# Patient Record
Sex: Male | Born: 1995 | Race: White | Hispanic: No | Marital: Single | Smoking: Never smoker
Health system: Southern US, Community
[De-identification: ages and names within clinical notes are randomized; demographics above are authoritative.]

## PROBLEM LIST (undated history)

## (undated) DIAGNOSIS — B86 Scabies: Secondary | ICD-10-CM

## (undated) DIAGNOSIS — K219 Gastro-esophageal reflux disease without esophagitis: Secondary | ICD-10-CM

## (undated) DIAGNOSIS — T50Z95A Adverse effect of other vaccines and biological substances, initial encounter: Secondary | ICD-10-CM

## (undated) DIAGNOSIS — B079 Viral wart, unspecified: Secondary | ICD-10-CM

## (undated) HISTORY — DX: Viral wart, unspecified: B07.9

## (undated) HISTORY — DX: Gastro-esophageal reflux disease without esophagitis: K21.9

## (undated) HISTORY — DX: Scabies: B86

## (undated) HISTORY — DX: Adverse effect of other vaccines and biological substances, initial encounter: T50.Z95A

## (undated) HISTORY — PX: OTHER SURGICAL HISTORY: SHX169

---
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
## 2016-01-09 ENCOUNTER — Ambulatory Visit (HOSPITAL_COMMUNITY)
Admission: RE | Admit: 2016-01-09 | Discharge: 2016-01-09 | Disposition: A | Discharge status: Home / Self Care | Payer: Medicaid Other | Source: Ambulatory Visit | Attending: Internal Medicine | Admitting: Internal Medicine

## 2016-01-09 ENCOUNTER — Other Ambulatory Visit (HOSPITAL_COMMUNITY): Payer: Self-pay | Admitting: Internal Medicine

## 2016-01-09 DIAGNOSIS — M542 Cervicalgia: Secondary | ICD-10-CM | POA: Diagnosis present

## 2016-08-17 ENCOUNTER — Emergency Department (HOSPITAL_COMMUNITY): Payer: Medicaid Other

## 2016-08-17 ENCOUNTER — Emergency Department (HOSPITAL_COMMUNITY)
Admission: EM | Admit: 2016-08-17 | Discharge: 2016-08-17 | Disposition: A | Discharge status: Home / Self Care | Payer: Medicaid Other | Attending: Emergency Medicine | Admitting: Emergency Medicine

## 2016-08-17 ENCOUNTER — Encounter (HOSPITAL_COMMUNITY): Payer: Self-pay | Admitting: *Deleted

## 2016-08-17 DIAGNOSIS — D696 Thrombocytopenia, unspecified: Secondary | ICD-10-CM | POA: Insufficient documentation

## 2016-08-17 DIAGNOSIS — R202 Paresthesia of skin: Secondary | ICD-10-CM | POA: Diagnosis not present

## 2016-08-17 DIAGNOSIS — R200 Anesthesia of skin: Secondary | ICD-10-CM | POA: Diagnosis present

## 2016-08-17 DIAGNOSIS — R0789 Other chest pain: Secondary | ICD-10-CM | POA: Diagnosis not present

## 2016-08-17 LAB — CBC WITH DIFFERENTIAL/PLATELET
BASOS ABS: 0 10*3/uL (ref 0.0–0.1)
BASOS PCT: 0 %
EOS ABS: 0.1 10*3/uL (ref 0.0–0.7)
Eosinophils Relative: 1 %
HCT: 43.5 % (ref 39.0–52.0)
HEMOGLOBIN: 15.6 g/dL (ref 13.0–17.0)
Lymphocytes Relative: 37 %
Lymphs Abs: 2.4 10*3/uL (ref 0.7–4.0)
MCH: 30.6 pg (ref 26.0–34.0)
MCHC: 35.9 g/dL (ref 30.0–36.0)
MCV: 85.5 fL (ref 78.0–100.0)
Monocytes Absolute: 0.5 10*3/uL (ref 0.1–1.0)
Monocytes Relative: 8 %
Neutro Abs: 3.5 10*3/uL (ref 1.7–7.7)
Neutrophils Relative %: 54 %
Platelets: 133 10*3/uL — ABNORMAL LOW (ref 150–400)
RBC: 5.09 MIL/uL (ref 4.22–5.81)
RDW: 12.1 % (ref 11.5–15.5)
WBC: 6.5 10*3/uL (ref 4.0–10.5)

## 2016-08-17 LAB — BASIC METABOLIC PANEL
Anion gap: 7 (ref 5–15)
BUN: 9 mg/dL (ref 6–20)
CALCIUM: 9.9 mg/dL (ref 8.9–10.3)
CO2: 28 mmol/L (ref 22–32)
CREATININE: 0.95 mg/dL (ref 0.61–1.24)
Chloride: 108 mmol/L (ref 101–111)
Glucose, Bld: 102 mg/dL — ABNORMAL HIGH (ref 65–99)
Potassium: 3.8 mmol/L (ref 3.5–5.1)
SODIUM: 143 mmol/L (ref 135–145)

## 2016-08-17 LAB — I-STAT TROPONIN, ED: TROPONIN I, POC: 0 ng/mL (ref 0.00–0.08)

## 2016-08-17 NOTE — ED Provider Notes (Signed)
[facility] DEPT Provider Note   CSN: [id number] Arrival date & time: 08/17/16  0033     History   Chief Complaint Chief Complaint  Patient presents with  . Numbness    HPI Carl Higgins is a 21 y.o. male.  The history is provided by the patient.  He had onset yesterday morning of numbness on the lateral aspect of his right calf. That has stayed fairly constant. Today, he noted numbness in the lateral aspect of his right upper arm. Numbness is been steady and not changing in location or character. He noted general weakness on getting out of the shower tonight, but no focal weakness. He denies headache or neck pain. Denies any difficulty with walking or balance or coordination. Also, during the day today, he has been having intermittent sharp pains in the left side of his chest. Pain will last about 30 seconds post for resolving. There is some mild dyspnea but no nausea or diaphoresis. Pain is not affected by exertion. He is a nonsmoker and denies no history of diabetes or hypertension or hyperlipidemia. He is worried because family members have had heart attacks when in their 4760s, but no family history of premature coronary atherosclerosis. He denies illicit drug use.  Past Medical History:  Diagnosis Date  . Adverse effect of other vaccines and biological substances, initial encounter   . Gastroesophageal reflux disease without esophagitis   . Scabies   . Viral wart     Patient Active Problem List   Diagnosis Date Noted  . Chest pain 01/26/2015  . Palpitations 01/26/2015    Past Surgical History:  Procedure Laterality Date  . WART REMOVAL         Home Medications    Prior to Admission medications   Not on File    Family History Family History  Problem Relation Age of Onset  . Pancreatitis Mother   . Heart disease Father        WPW  . Bardet-Biedl Syndrome Brother   . Diabetes Maternal Grandfather   . Hypertension Maternal Grandfather   . Heart attack Paternal  Grandfather     Social History Social History  Substance Use Topics  . Smoking status: Never Smoker  . Smokeless tobacco: Never Used  . Alcohol use No     Allergies   Amoxicillin and Sulfa antibiotics   Review of Systems Review of Systems  All other systems reviewed and are negative.    Physical Exam Updated Vital Signs BP (!) 163/92 (BP Location: Right Arm)   Pulse 62   Temp 97.9 F (36.6 C) (Oral)   Resp 18   Ht 5\' 9"  (1.753 m)   Wt 59.9 kg (132 lb)   SpO2 100%   BMI 19.49 kg/m   Physical Exam  Nursing note and vitals reviewed.  21 year old \\male , resting comfortably and in no acute distress. Vital signs are significant for hypertension. Oxygen saturation is 100%, which is normal. Head is normocephalic and atraumatic. PERRLA, EOMI. Oropharynx is clear. Neck is nontender and supple without adenopathy or JVD. Back is nontender and there is no CVA tenderness. Lungs are clear without rales, wheezes, or rhonchi. Chest is nontender. Heart has regular rate and rhythm without murmur. Abdomen is soft, flat, nontender without masses or hepatosplenomegaly and peristalsis is normoactive. Extremities have no cyanosis or edema, full range of motion is present. Skin is warm and dry without rash. Neurologic: Mental status is normal, cranial nerves are intact, there are no motor or  sensory deficits. Strength is 5/5 in both arms and both legs. There is no pronator drift. There is no extinction on double simultaneous stimulation.  ED Treatments / Results  Labs (all labs ordered are listed, but only abnormal results are displayed) Labs Reviewed  BASIC METABOLIC PANEL - Abnormal; Notable for the following:       Result Value   Glucose, Bld 102 (*)    All other components within normal limits  CBC WITH DIFFERENTIAL/PLATELET - Abnormal; Notable for the following:    Platelets 133 (*)    All other components within normal limits  I-STAT TROPOININ, ED    EKG  EKG  Interpretation  Date/Time:  Sunday Aug 17 2016 02:34:32 EDT Ventricular Rate:  64 PR Interval:    QRS Duration: 109 QT Interval:  402 QTC Calculation: 415 R Axis:   98 Text Interpretation:  Sinus rhythm Short PR interval Borderline right axis deviation Probable left ventricular hypertrophy Early repolarization No old tracing to compare Confirmed by Dione Booze ([id number]) on 08/17/2016 2:42:18 AM       Radiology Dg Chest 2 View  Result Date: 08/17/2016 CLINICAL DATA:  Increasing numbness to the right leg for 1 day. Chest pain. EXAM: CHEST  2 VIEW COMPARISON:  None. FINDINGS: The heart size and mediastinal contours are within normal limits. Both lungs are clear. The visualized skeletal structures are unremarkable. IMPRESSION: No active cardiopulmonary disease. Electronically Signed   By: Burman Nieves M.D.   On: 08/17/2016 02:09   Ct Head Wo Contrast  Result Date: 08/17/2016 CLINICAL DATA:  Increasing RIGHT leg numbness for 1 day. EXAM: CT HEAD WITHOUT CONTRAST TECHNIQUE: Contiguous axial images were obtained from the base of the skull through the vertex without intravenous contrast. COMPARISON:  None. FINDINGS: BRAIN: No intraparenchymal hemorrhage, mass effect nor midline shift. The ventricles and sulci are normal. No acute large vascular territory infarcts. No abnormal extra-axial fluid collections. Basal cisterns are patent. VASCULAR: Unremarkable. SKULL/SOFT TISSUES: No skull fracture. No significant soft tissue swelling. ORBITS/SINUSES: The included ocular globes and orbital contents are normal.The mastoid aircells and included paranasal sinuses are well-aerated. OTHER: None. IMPRESSION: Normal noncontrast CT HEAD. Electronically Signed   By: Awilda Metro M.D.   On: 08/17/2016 02:00    Procedures Procedures (including critical care time)  Medications Ordered in ED Medications - No data to display   Initial Impression / Assessment and Plan / ED Course  I have reviewed the  triage vital signs and the nursing notes.  Pertinent labs & imaging results that were available during my care of the patient were reviewed by me and considered in my medical decision making (see chart for details).  Numbness of uncertain cause. No objective neurologic deficit. Chest pain of uncertain cause, but very unlikely to be cardiac. Since neurologic symptoms of been present for well over 24 hours, code stroke is not initiated. Also, CT head should show any stroke which might have caused his symptoms. He is sent for CT of head. MRI is not available here today, but I do not feel he needs to be transferred for emergent MRI. Chest pain is evaluated with ECG and troponin. Old records are reviewed, and he has no relevant past visits.  ED workup is unremarkable. CT scan is unremarkable. ECG and chest x-ray are normal as is laboratory workup with exception of borderline low platelet count. He is reassured that workup is benign and is referred back to PCP for further outpatient workup.  Final Clinical Impressions(s) /  ED Diagnoses   Final diagnoses:  Paresthesia  Atypical chest pain  Thrombocytopenia (HCC)    New Prescriptions New Prescriptions   No medications on file     Dione Booze, MD 08/17/16 [phone]

## 2016-08-17 NOTE — ED Triage Notes (Signed)
Pt c/o increasing numbness to right leg x 1 day

## 2016-08-17 NOTE — Discharge Instructions (Signed)
You may take acetaminophen, ibuprofen or naproxen as needed for pain. Follow up with you doctor for further evaluation of the numbness. Return if symptoms are getting worse.

## 2016-08-17 NOTE — ED Notes (Signed)
Patient transported to X-ray 

## 2017-10-08 IMAGING — DX DG CHEST 2V
2 series · 2 of 2 positions shown · non-contrast
Comparison: None.

CLINICAL DATA: Increasing numbness to the right leg for 1 day.
Chest pain.

EXAM:
CHEST  2 VIEW

[chest pa]
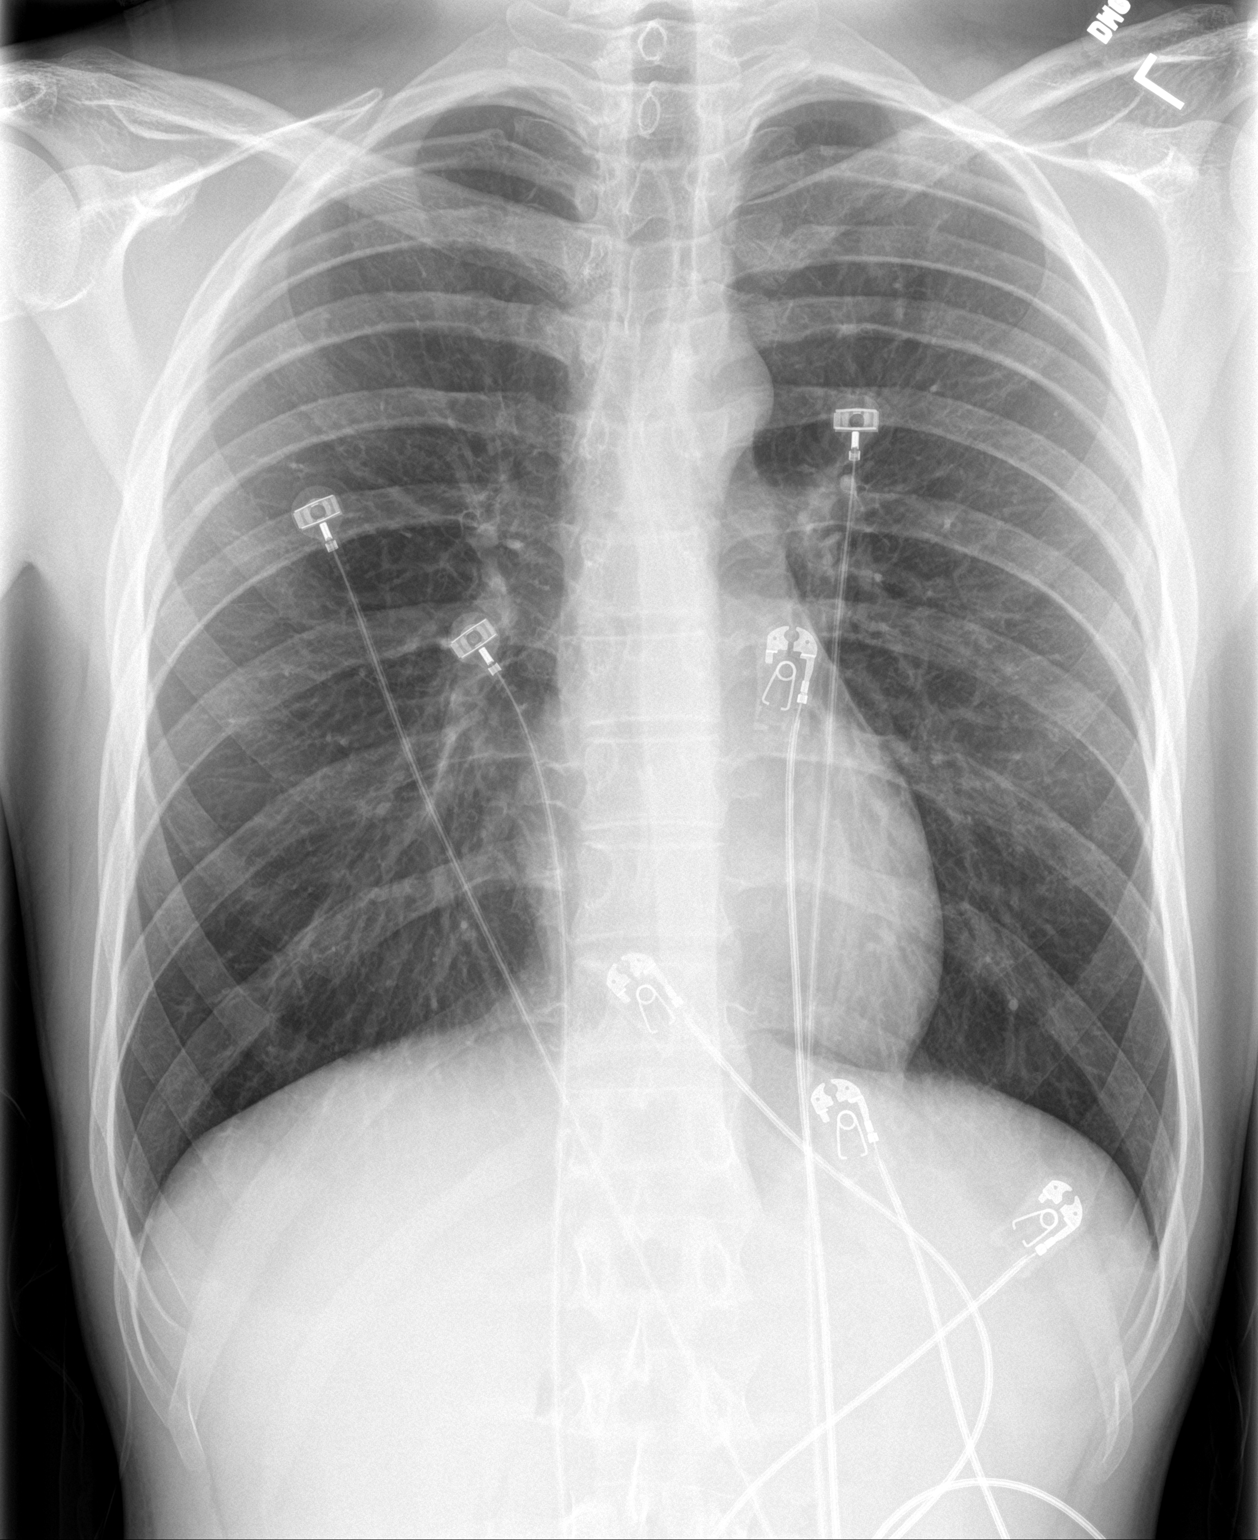

[chest lat]
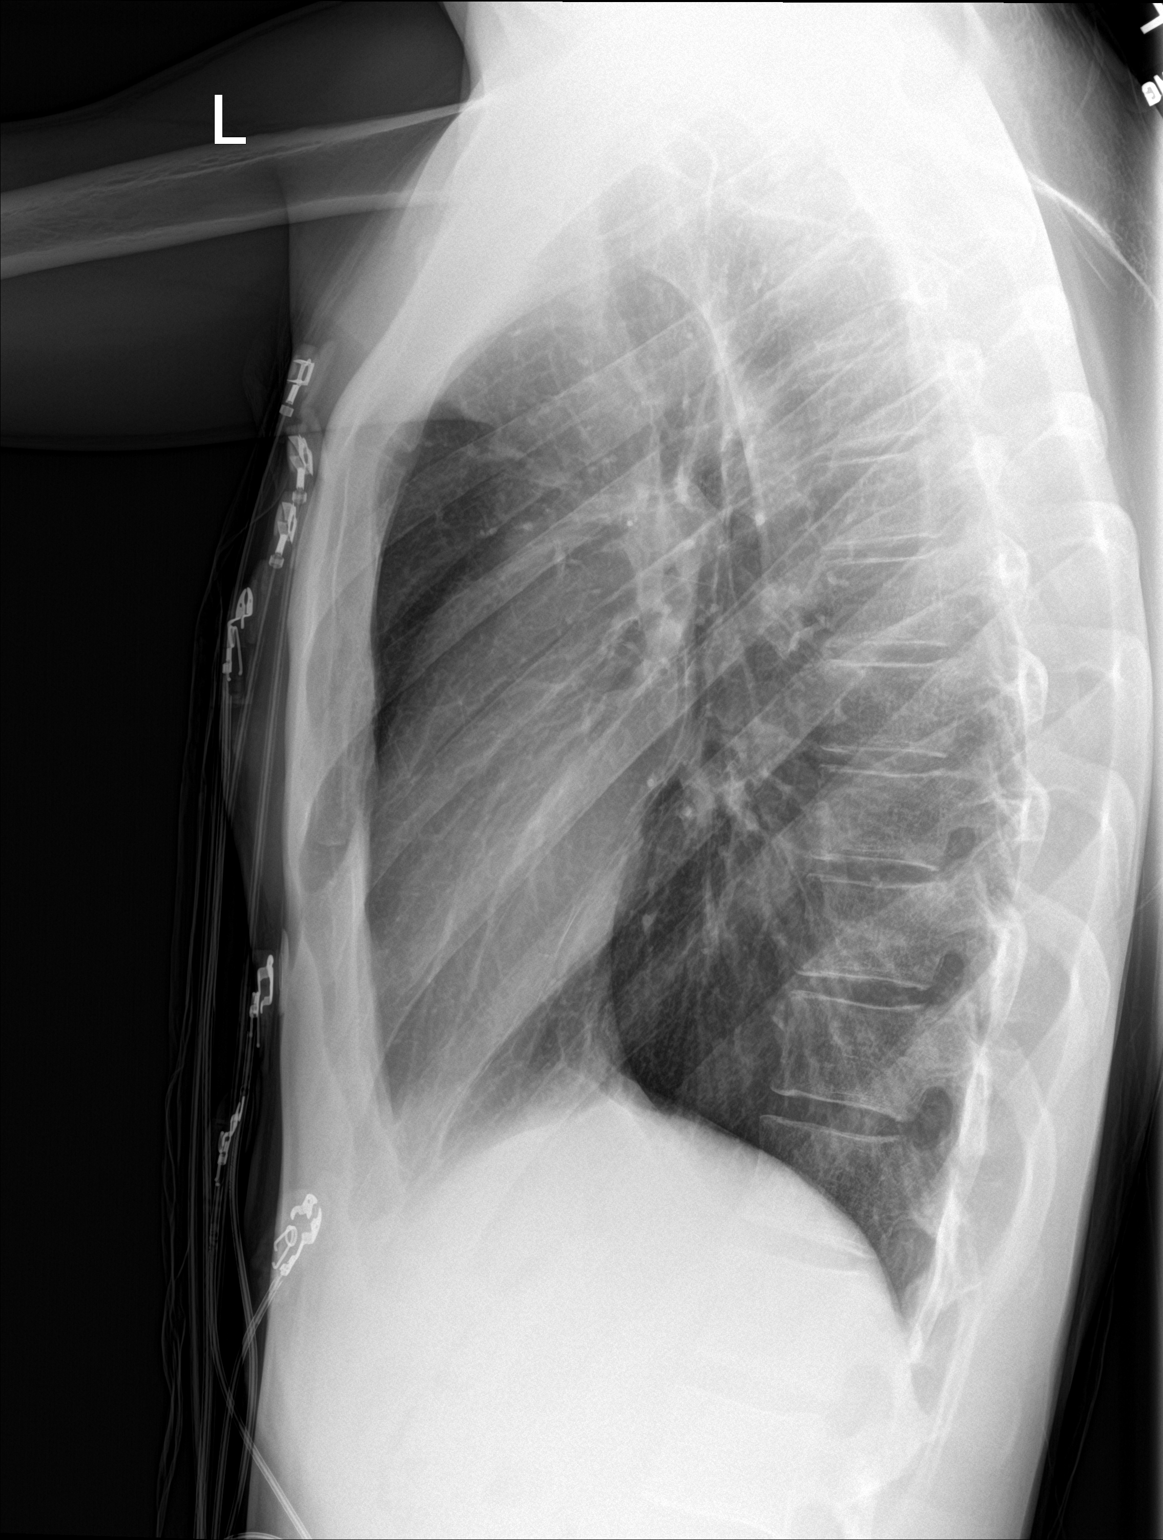

[2 of 2 positions shown; findings below may reference images not displayed]

FINDINGS: The heart size and mediastinal contours are within normal limits.
Both lungs are clear. The visualized skeletal structures are
unremarkable.
IMPRESSION: No active cardiopulmonary disease.

## 2018-09-16 ENCOUNTER — Encounter (HOSPITAL_COMMUNITY): Payer: Self-pay | Admitting: Emergency Medicine

## 2018-09-16 ENCOUNTER — Emergency Department (HOSPITAL_COMMUNITY)
Admission: EM | Admit: 2018-09-16 | Discharge: 2018-09-17 | Disposition: A | Discharge status: Home / Self Care | Payer: Self-pay | Attending: Emergency Medicine | Admitting: Emergency Medicine

## 2018-09-16 ENCOUNTER — Other Ambulatory Visit: Payer: Self-pay

## 2018-09-16 DIAGNOSIS — Y929 Unspecified place or not applicable: Secondary | ICD-10-CM | POA: Insufficient documentation

## 2018-09-16 DIAGNOSIS — W260XXA Contact with knife, initial encounter: Secondary | ICD-10-CM | POA: Insufficient documentation

## 2018-09-16 DIAGNOSIS — S61212A Laceration without foreign body of right middle finger without damage to nail, initial encounter: Secondary | ICD-10-CM | POA: Insufficient documentation

## 2018-09-16 DIAGNOSIS — Z5321 Procedure and treatment not carried out due to patient leaving prior to being seen by health care provider: Secondary | ICD-10-CM | POA: Insufficient documentation

## 2018-09-16 DIAGNOSIS — Y999 Unspecified external cause status: Secondary | ICD-10-CM | POA: Insufficient documentation

## 2018-09-16 DIAGNOSIS — Y93G1 Activity, food preparation and clean up: Secondary | ICD-10-CM | POA: Insufficient documentation

## 2018-09-16 NOTE — ED Triage Notes (Addendum)
Pt states he was getting a knife to cut chicken and the knife slipped out his hand and went to grab the knife and it cut his right middle finger. Bleeding controlled.

## 2019-02-01 ENCOUNTER — Other Ambulatory Visit: Payer: Self-pay

## 2019-02-01 DIAGNOSIS — Z20822 Contact with and (suspected) exposure to covid-19: Secondary | ICD-10-CM

## 2019-02-02 ENCOUNTER — Telehealth: Payer: Self-pay

## 2019-02-02 NOTE — Telephone Encounter (Signed)
Person identifing ans patient wife calling to get his test result. She is not listed on DPR and she was advised to have her husband call back for result.

## 2019-02-02 NOTE — Telephone Encounter (Signed)
Patient called in requesting MyChart set up and COVID19 lab results - DOB/Address verified - results pending. Reviewed testing process with patient; assisted with MyChart set up, no further questions. 

## 2019-02-03 LAB — NOVEL CORONAVIRUS, NAA: SARS-CoV-2, NAA: NOT DETECTED

## 2019-05-30 ENCOUNTER — Ambulatory Visit: Payer: Self-pay | Attending: Internal Medicine

## 2019-05-30 ENCOUNTER — Other Ambulatory Visit: Payer: Self-pay

## 2019-05-30 DIAGNOSIS — Z20822 Contact with and (suspected) exposure to covid-19: Secondary | ICD-10-CM | POA: Insufficient documentation

## 2019-05-31 LAB — NOVEL CORONAVIRUS, NAA: SARS-CoV-2, NAA: NOT DETECTED

## 2020-04-05 ENCOUNTER — Other Ambulatory Visit: Payer: Self-pay

## 2021-06-17 ENCOUNTER — Ambulatory Visit: Payer: Self-pay

## 2021-06-18 ENCOUNTER — Ambulatory Visit (INDEPENDENT_AMBULATORY_CARE_PROVIDER_SITE_OTHER): Payer: PRIVATE HEALTH INSURANCE

## 2021-06-18 ENCOUNTER — Ambulatory Visit (HOSPITAL_COMMUNITY)
Admission: RE | Admit: 2021-06-18 | Discharge: 2021-06-18 | Disposition: A | Discharge status: Home / Self Care | Payer: PRIVATE HEALTH INSURANCE | Source: Ambulatory Visit | Attending: Emergency Medicine | Admitting: Emergency Medicine

## 2021-06-18 ENCOUNTER — Other Ambulatory Visit: Payer: Self-pay

## 2021-06-18 ENCOUNTER — Encounter (HOSPITAL_COMMUNITY): Payer: Self-pay

## 2021-06-18 VITALS — BP 149/83 | HR 82 | Temp 98.3°F | Resp 17

## 2021-06-18 DIAGNOSIS — M79632 Pain in left forearm: Secondary | ICD-10-CM

## 2021-06-18 DIAGNOSIS — W19XXXA Unspecified fall, initial encounter: Secondary | ICD-10-CM | POA: Diagnosis not present

## 2021-06-18 NOTE — ED Triage Notes (Signed)
Pt is present today with left arm injury. Pt states that he tripped and fell on concert Saturday night. Pt states that it is painful to bend his arm and has visible bruising and swelling.  ?

## 2021-06-18 NOTE — ED Provider Notes (Signed)
?[facility] ? ? ? ?CSN: [id number] ?Arrival date & time: 06/18/21  1355 ? ? ?  ? ?History   ?Chief Complaint ?Chief Complaint  ?Patient presents with  ? Arm Injury  ?  [address] and hurt my arm - Entered by patient  ? ? ?HPI ?Haidan Rin is a 26 y.o. male.  ? ?Patient presents with left forearm pain for 3 days after fall.  Endorses that he fell at a concert landing directly onto the area.  Bruising present and pain can be felt when bending the arm.  Pain is radiating into the upper arm.  Denies numbness, tingling, prior injury or trauma.  Has not attempted treatment of symptoms. ? ?Past Medical History:  ?Diagnosis Date  ? Adverse effect of other vaccines and biological substances, initial encounter   ? Gastroesophageal reflux disease without esophagitis   ? Scabies   ? Viral wart   ? ? ?Patient Active Problem List  ? Diagnosis Date Noted  ? Chest pain 01/26/2015  ? Palpitations 01/26/2015  ? ? ?Past Surgical History:  ?Procedure Laterality Date  ? WART REMOVAL    ? ? ? ? ? ?Home Medications   ? ?Prior to Admission medications   ?Not on File  ? ? ?Family History ?Family History  ?Problem Relation Age of Onset  ? Pancreatitis Mother   ? Heart disease Father   ?     WPW  ? Bardet-Biedl Syndrome Brother   ? Diabetes Maternal Grandfather   ? Hypertension Maternal Grandfather   ? Heart attack Paternal Grandfather   ? ? ?Social History ?Social History  ? ?Tobacco Use  ? Smoking status: Never  ? Smokeless tobacco: Never  ?Substance Use Topics  ? Alcohol use: No  ?  Alcohol/week: 0.0 standard drinks  ? Drug use: No  ? ? ? ?Allergies   ?Amoxicillin and Sulfa antibiotics ? ? ?Review of Systems ?Review of Systems ?Defer to hpi ? ? ?Physical Exam ?Triage Vital Signs ?ED Triage Vitals  ?Enc Vitals Group  ?   BP 06/18/21 1424 (!) 149/83  ?   Pulse Rate 06/18/21 1424 82  ?   Resp 06/18/21 1424 17  ?   Temp 06/18/21 1424 98.3 ?F (36.8 ?C)  ?   Temp src --   ?   SpO2 06/18/21 1424 100 %  ?   Weight --   ?   Height --   ?   Head  Circumference --   ?   Peak Flow --   ?   Pain Score 06/18/21 1423 7  ?   Pain Loc --   ?   Pain Edu? --   ?   Excl. in [place]? --   ? ?No data found. ? ?Updated Vital Signs ?BP (!) 149/83   Pulse 82   Temp 98.3 ?F (36.8 ?C)   Resp 17   SpO2 100%  ? ?Visual Acuity ?Right Eye Distance:   ?Left Eye Distance:   ?Bilateral Distance:   ? ?Right Eye Near:   ?Left Eye Near:    ?Bilateral Near:    ? ?Physical Exam ?Constitutional:   ?   Appearance: Normal appearance.  ?Eyes:  ?   Extraocular Movements: Extraocular movements intact.  ?Pulmonary:  ?   Effort: Pulmonary effort is normal.  ?Musculoskeletal:  ?   Comments: Ecchymosis and mild to moderate swelling present to the medial aspect of the left forearm with no involvement of the epicondyles, no point tenderness noted, range of  motion intact, 2+ brachial pulse, sensation intact  ?Neurological:  ?   Mental Status: He is alert and oriented to person, place, and time. Mental status is at baseline.  ?Psychiatric:     ?   Mood and Affect: Mood normal.     ?   Behavior: Behavior normal.  ? ? ? ?UC Treatments / Results  ?Labs ?(all labs ordered are listed, but only abnormal results are displayed) ?Labs Reviewed - No data to display ? ?EKG ? ? ?Radiology ?DG Forearm Left ? ?Result Date: 06/18/2021 ?CLINICAL DATA:  [address] with pain and bruising of the forearm EXAM: LEFT FOREARM - 2 VIEW COMPARISON:  None. FINDINGS: There is no evidence of fracture or other focal bone lesions. Soft tissues are unremarkable. Insignificant small bone island of the distal radius. IMPRESSION: Negative radiographs. Electronically Signed   By: Nelson Chimes M.D.   On: 06/18/2021 15:02   ? ?Procedures ?Procedures (including critical care time) ? ?Medications Ordered in UC ?Medications - No data to display ? ?Initial Impression / Assessment and Plan / UC Course  ?I have reviewed the triage vital signs and the nursing notes. ? ?Pertinent labs & imaging results that were available during my care of the patient  were reviewed by me and considered in my medical decision making (see chart for details). ? ?Left forearm pain ? ?X-ray negative, discussed findings with patient, recommend ibuprofen to use consistently for the next 5 days to further help reduce swelling RICE, activity as tolerated, given if pain continues to persist past 2 weeks, may follow-up with urgent care as needed, work note given ? ? ?Final Clinical Impressions(s) / UC Diagnoses  ? ?Final diagnoses:  ?Left forearm pain  ? ? ? ?Discharge Instructions   ? ?  ?Your pain is most likely caused by irritation to the soft tissues ? ?X-ray was negative for injury to the bone ? ?Take 600 to 800 mg of ibuprofen 3 times a day for the next 5 days to further help reduce the swelling which in turn will help with your discomfort ? ?You may use heating pad in 15 minute intervals as needed for additional comfort or you may find comfort in using ice in 10-15 minutes over affected area ? ?Begin stretching affected area daily for 10 minutes as tolerated to further loosen muscles  ? ?When lying down place pillow underneath arm for support while sitting or lying ? ?If pain persist after recommended treatment or reoccurs if may be beneficial to follow up with orthopedic specialist for evaluation, this doctor specializes in the bones and can manage your symptoms long-term with options such as but not limited to imaging, medications or physical therapy  ?  ? ? ?ED Prescriptions   ?None ?  ? ?PDMP not reviewed this encounter. ?  ?Hans Eden, NP ?06/18/21 1610 ? ?

## 2021-06-18 NOTE — Discharge Instructions (Signed)
Your pain is most likely caused by irritation to the soft tissues ? ?X-ray was negative for injury to the bone ? ?Take 600 to 800 mg of ibuprofen 3 times a day for the next 5 days to further help reduce the swelling which in turn will help with your discomfort ? ?You may use heating pad in 15 minute intervals as needed for additional comfort or you may find comfort in using ice in 10-15 minutes over affected area ? ?Begin stretching affected area daily for 10 minutes as tolerated to further loosen muscles  ? ?When lying down place pillow underneath arm for support while sitting or lying ? ?If pain persist after recommended treatment or reoccurs if may be beneficial to follow up with orthopedic specialist for evaluation, this doctor specializes in the bones and can manage your symptoms long-term with options such as but not limited to imaging, medications or physical therapy  ?  ?

## 2022-08-09 IMAGING — DX DG FOREARM 2V*L*
2 series · 2 of 2 positions shown · non-contrast
Comparison: None.

CLINICAL DATA: Fell with pain and bruising of the forearm

EXAM:
LEFT FOREARM - 2 VIEW

[forearm ap]
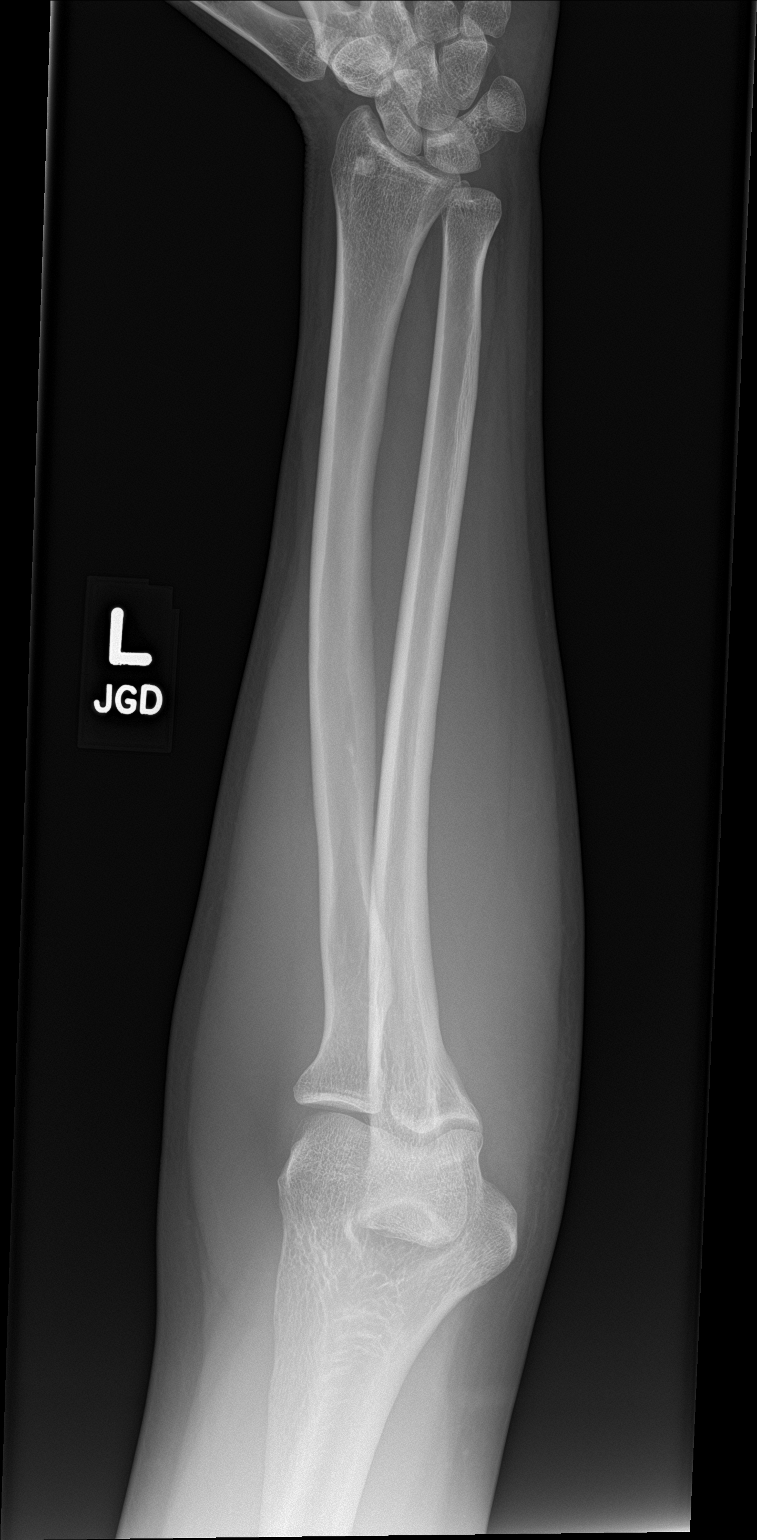

[forearm lat]
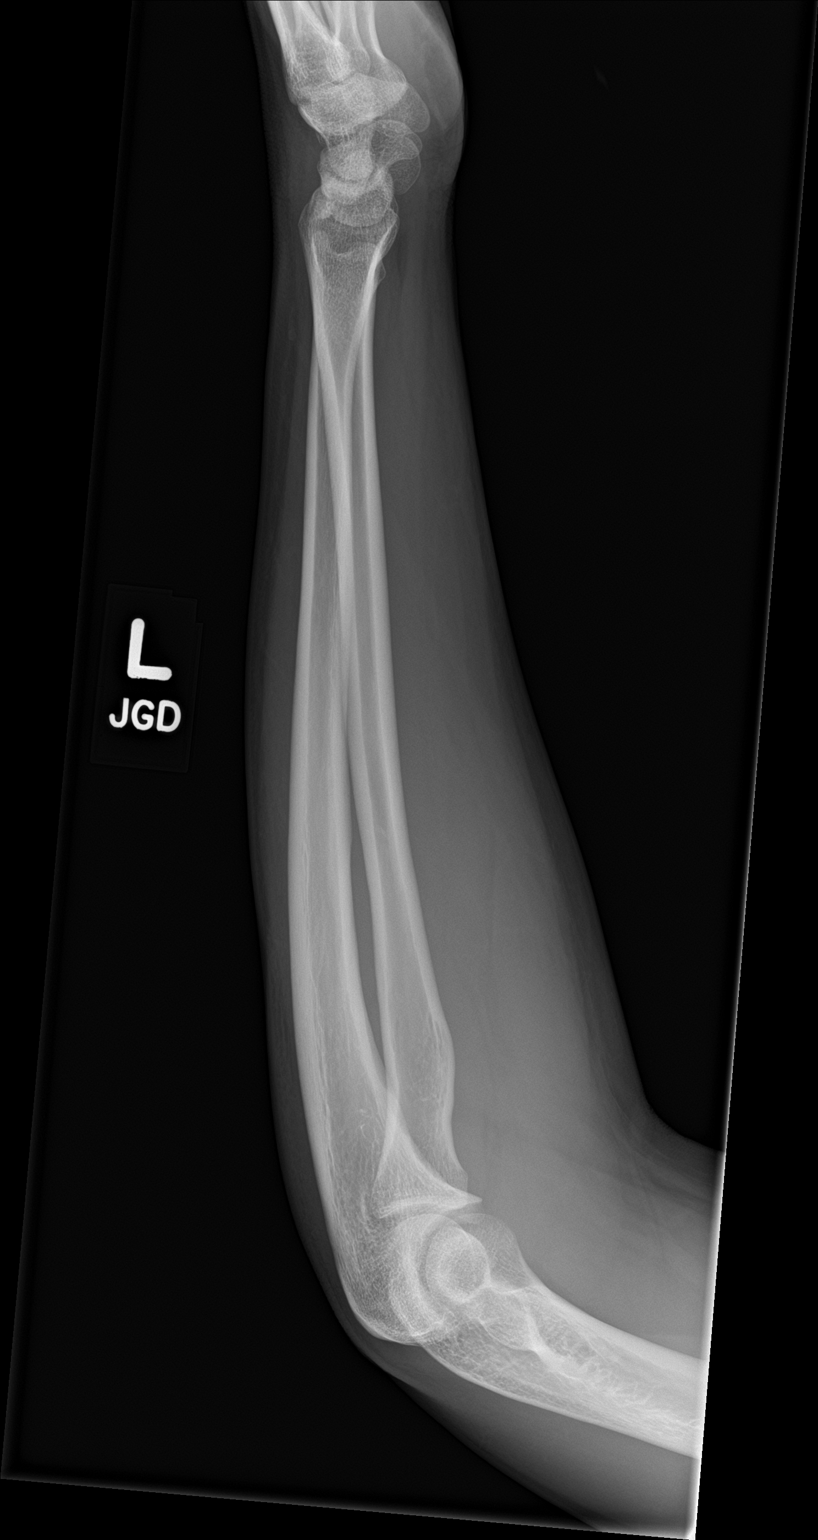

[2 of 2 positions shown; findings below may reference images not displayed]

FINDINGS: There is no evidence of fracture or other focal bone lesions. Soft
tissues are unremarkable. Insignificant small bone island of the
distal radius.
IMPRESSION: Negative radiographs.

## 2023-09-03 ENCOUNTER — Telehealth: Payer: Self-pay | Admitting: Nurse Practitioner

## 2023-09-03 ENCOUNTER — Other Ambulatory Visit: Payer: Self-pay

## 2023-09-03 ENCOUNTER — Ambulatory Visit (HOSPITAL_COMMUNITY)
Admission: RE | Admit: 2023-09-03 | Discharge: 2023-09-03 | Disposition: A | Discharge status: Home / Self Care | Payer: PRIVATE HEALTH INSURANCE | Source: Ambulatory Visit | Attending: Nurse Practitioner | Admitting: Nurse Practitioner

## 2023-09-03 ENCOUNTER — Ambulatory Visit
Admission: EM | Admit: 2023-09-03 | Discharge: 2023-09-03 | Disposition: A | Discharge status: Home / Self Care | Payer: PRIVATE HEALTH INSURANCE | Attending: Nurse Practitioner | Admitting: Nurse Practitioner

## 2023-09-03 DIAGNOSIS — R079 Chest pain, unspecified: Secondary | ICD-10-CM

## 2023-09-03 DIAGNOSIS — R5383 Other fatigue: Secondary | ICD-10-CM | POA: Diagnosis not present

## 2023-09-03 LAB — POCT URINALYSIS DIP (MANUAL ENTRY)
Bilirubin, UA: NEGATIVE
Blood, UA: NEGATIVE
Glucose, UA: NEGATIVE mg/dL
Ketones, POC UA: NEGATIVE mg/dL
Leukocytes, UA: NEGATIVE
Nitrite, UA: NEGATIVE
Protein Ur, POC: NEGATIVE mg/dL
Spec Grav, UA: 1.015 (ref 1.010–1.025)
Urobilinogen, UA: 1 U/dL
pH, UA: 8 (ref 5.0–8.0)

## 2023-09-03 NOTE — Telephone Encounter (Signed)
 Erroneous encounter

## 2023-09-03 NOTE — ED Triage Notes (Signed)
 Pt reports chest pain and tightness in his chest, with weakness x 5 days and states this morning pt coughed up a blood clot

## 2023-09-03 NOTE — ED Provider Notes (Signed)
 [facility]    CSN: [id number] Arrival date & time: 09/03/23  1751      History   Chief Complaint No chief complaint on file.   HPI Carl Higgins is a 28 y.o. male.   The history is provided by the patient.   Patient presents with a 1 week history of chest pain and generalized fatigue.  Patient states pain is in the middle of his chest.  He states the pain feels like a pulling sensation.  He states that the pain occurs at least twice daily and has been doing so for the past week.  He states that he has not experienced any shortness of breath, difficulty breathing, nausea, vomiting, radiation of pain into the jaw or down the arm, or diaphoresis.  Patient states that he does have underlying history of reflux, states that he has been taking his Prilosec.  He states my diet is not the best but does understand some of the triggers that may aggravate his reflux disease.  Patient states this morning, he did cough up blood-tinged sputum.  Patient states that he did receive workup from cardiology back in 2016, but nothing was ever found.  Patient reports his brother does have an underlying history of Bardet-Biedl syndrome and father has a history of [employer].  Reviewed patient's chart, echocardiogram was also performed in 2016 which was negative.  Past Medical History:  Diagnosis Date   Adverse effect of other vaccines and biological substances, initial encounter    Gastroesophageal reflux disease without esophagitis    Scabies    Viral wart     Patient Active Problem List   Diagnosis Date Noted   Chest pain 01/26/2015   Palpitations 01/26/2015    Past Surgical History:  Procedure Laterality Date   WART REMOVAL         Home Medications    Prior to Admission medications   Not on File    Family History Family History  Problem Relation Age of Onset   Pancreatitis Mother    Heart disease Father        WPW   Bardet-Biedl Syndrome Brother    Diabetes  Maternal Grandfather    Hypertension Maternal Grandfather    Heart attack Paternal Grandfather     Social History Social History   Tobacco Use   Smoking status: Never   Smokeless tobacco: Never  Substance Use Topics   Alcohol use: No    Alcohol/week: 0.0 standard drinks of alcohol   Drug use: No     Allergies   Amoxicillin and Sulfa antibiotics   Review of Systems Review of Systems Per HPI  Physical Exam Triage Vital Signs ED Triage Vitals  Encounter Vitals Group     BP 09/03/23 1758 (!) 153/94     Girls Systolic BP Percentile --      Girls Diastolic BP Percentile --      Boys Systolic BP Percentile --      Boys Diastolic BP Percentile --      Pulse Rate 09/03/23 1758 89     Resp 09/03/23 1758 18     Temp 09/03/23 1758 98.4 F (36.9 C)     Temp Source 09/03/23 1758 Oral     SpO2 09/03/23 1758 97 %     Weight --      Height --      Head Circumference --      Peak Flow --      Pain Score 09/03/23 1802  0     Pain Loc --      Pain Education --      Exclude from Growth Chart --    No data found.  Updated Vital Signs BP (!) 153/94 (BP Location: Right Arm)   Pulse 89   Temp 98.4 F (36.9 C) (Oral)   Resp 18   SpO2 97%   Visual Acuity Right Eye Distance:   Left Eye Distance:   Bilateral Distance:    Right Eye Near:   Left Eye Near:    Bilateral Near:     Physical Exam Vitals and nursing note reviewed.  Constitutional:      General: He is not in acute distress.    Appearance: Normal appearance.  HENT:     Head: Normocephalic.     Mouth/Throat:     Mouth: Mucous membranes are moist.   Eyes:     Extraocular Movements: Extraocular movements intact.     Pupils: Pupils are equal, round, and reactive to light.    Cardiovascular:     Rate and Rhythm: Normal rate and regular rhythm.     Pulses: Normal pulses.     Heart sounds: Normal heart sounds.  Pulmonary:     Effort: Pulmonary effort is normal. No respiratory distress.     Breath sounds:  Normal breath sounds. No stridor. No wheezing, rhonchi or rales.  Abdominal:     Palpations: Abdomen is soft.     Tenderness: There is no abdominal tenderness.   Musculoskeletal:     Cervical back: Normal range of motion.   Skin:    General: Skin is warm and dry.   Neurological:     General: No focal deficit present.     Mental Status: He is alert and oriented to person, place, and time.   Psychiatric:        Mood and Affect: Mood normal.        Behavior: Behavior normal.      UC Treatments / Results  Labs (all labs ordered are listed, but only abnormal results are displayed) Labs Reviewed - No data to display  EKG: Normal sinus rhythm, questionable right bundle branch block, no STEMI.  Compared to EKGs dated 08/17/2016, 08/19/2016, and 01/26/2015.   Radiology No results found.  Procedures Procedures (including critical care time)  Medications Ordered in UC Medications - No data to display  Initial Impression / Assessment and Plan / UC Course  I have reviewed the triage vital signs and the nursing notes.  Pertinent labs & imaging results that were available during my care of the patient were reviewed by me and considered in my medical decision making (see chart for details).  Chest x-ray is pending.  EKG shows normal sinus rhythm with questionable right bundle branch block, no STEMI.  Compared to EKGs performed in the past, which is suspicious for some EKG changes.  Urinalysis, CBC, and BMP performed for safety.  Urinalysis was negative.  Will have patient continue his Prilosec daily and to continue to monitor for reflux triggers.  Patient was advised that given his current symptoms and history, it is recommended that he follow-up with cardiology for further evaluation.  Patient states that he also currently does not have a PCP.  Patient was given information on how to establish care.  Patient was given information to follow-up with [facility] heart care.  Patient was given strict  ER follow-up precautions to include worsening chest pain, shortness of breath, difficulty breathing, or other concerns.  Patient was in agreement with this plan of care and verbalizes understanding.  All questions were answered.  Patient stable for discharge.   Final Clinical Impressions(s) / UC Diagnoses   Final diagnoses:  None   Discharge Instructions   None    ED Prescriptions   None    PDMP not reviewed this encounter.   Hardy Lia, NP 09/03/23 1851

## 2023-09-03 NOTE — Discharge Instructions (Addendum)
 Go to [facility] through the emergency department to obtain a chest x-ray.  Let them know that you are there only for a chest x-ray.  You will be contacted if the results of the chest x-ray are abnormal.  You will also access to your results via MyChart. Your urinalysis was negative.  Lab results are pending.  You will also be contacted if the results are abnormal. Continue your current medications for reflux. Drink plenty of fluids and allow for plenty of rest. Continue to avoid foods that may trigger your acid reflux. Go to the emergency department immediately if you experience worsening chest pain, shortness of breath, difficulty breathing, or other concerns. Please try to establish care with a primary care physician.  I have placed a referral for you to follow-up with cardiology.  You will need to call to schedule an appointment for further evaluation. Follow-up as needed.

## 2023-09-03 NOTE — Telephone Encounter (Signed)
 Received and reviewed patient's chest x-ray results.  Called patient to discuss same.  Spoke with patient, verified patient with 2 patient identifiers.  Patient was advised that the chest x-ray was negative.  Patient advised to continue with current treatment recommendations along with reiterating indications to go to the emergency department.  Patient was in agreement with this plan of care and verbalized understanding.  All questions were answered.

## 2023-09-04 ENCOUNTER — Ambulatory Visit (HOSPITAL_COMMUNITY): Payer: Self-pay

## 2023-09-04 LAB — CBC WITH DIFFERENTIAL/PLATELET
Basophils Absolute: 0.1 10*3/uL (ref 0.0–0.2)
Basos: 1 %
EOS (ABSOLUTE): 0.1 10*3/uL (ref 0.0–0.4)
Eos: 2 %
Hematocrit: 46.4 % (ref 37.5–51.0)
Hemoglobin: 15.7 g/dL (ref 13.0–17.7)
Immature Grans (Abs): 0 10*3/uL (ref 0.0–0.1)
Immature Granulocytes: 0 %
Lymphocytes Absolute: 1.8 10*3/uL (ref 0.7–3.1)
Lymphs: 26 %
MCH: 30.6 pg (ref 26.6–33.0)
MCHC: 33.8 g/dL (ref 31.5–35.7)
MCV: 90 fL (ref 79–97)
Monocytes Absolute: 0.7 10*3/uL (ref 0.1–0.9)
Monocytes: 9 %
Neutrophils Absolute: 4.4 10*3/uL (ref 1.4–7.0)
Neutrophils: 62 %
Platelets: 177 10*3/uL (ref 150–450)
RBC: 5.13 x10E6/uL (ref 4.14–5.80)
RDW: 12.7 % (ref 11.6–15.4)
WBC: 7.1 10*3/uL (ref 3.4–10.8)

## 2023-09-04 LAB — BASIC METABOLIC PANEL WITH GFR
BUN/Creatinine Ratio: 10 (ref 9–20)
BUN: 11 mg/dL (ref 6–20)
CO2: 22 mmol/L (ref 20–29)
Calcium: 9.8 mg/dL (ref 8.7–10.2)
Chloride: 106 mmol/L (ref 96–106)
Creatinine, Ser: 1.11 mg/dL (ref 0.76–1.27)
Glucose: 88 mg/dL (ref 70–99)
Potassium: 4.1 mmol/L (ref 3.5–5.2)
Sodium: 144 mmol/L (ref 134–144)
eGFR: 94 mL/min/{1.73_m2} (ref >59)

## 2024-04-04 ENCOUNTER — Ambulatory Visit
Admission: RE | Admit: 2024-04-04 | Discharge: 2024-04-04 | Disposition: A | Discharge status: Home / Self Care | Source: Ambulatory Visit | Attending: Nurse Practitioner | Admitting: Nurse Practitioner

## 2024-04-04 ENCOUNTER — Other Ambulatory Visit: Payer: Self-pay

## 2024-04-04 VITALS — BP 150/84 | HR 87 | Temp 98.1°F | Resp 18

## 2024-04-04 DIAGNOSIS — M542 Cervicalgia: Secondary | ICD-10-CM

## 2024-04-04 DIAGNOSIS — R42 Dizziness and giddiness: Secondary | ICD-10-CM | POA: Diagnosis not present

## 2024-04-04 LAB — POCT URINE DIPSTICK
Bilirubin, UA: NEGATIVE
Blood, UA: NEGATIVE
Glucose, UA: NEGATIVE mg/dL
Ketones, POC UA: NEGATIVE mg/dL
Leukocytes, UA: NEGATIVE
Nitrite, UA: NEGATIVE
POC PROTEIN,UA: NEGATIVE
Spec Grav, UA: 1.015
Urobilinogen, UA: 0.2 U/dL
pH, UA: 6

## 2024-04-04 LAB — GLUCOSE, POCT (MANUAL RESULT ENTRY): POCT Glucose (KUC): 98 mg/dL (ref 70–99)

## 2024-04-04 MED ORDER — TIZANIDINE HCL 4 MG PO TABS: 4.0000 mg | ORAL_TABLET | Freq: Three times a day (TID) | ORAL | 0 refills | Status: AC | PRN

## 2024-04-04 MED ORDER — MECLIZINE HCL 12.5 MG PO TABS: 12.5000 mg | ORAL_TABLET | Freq: Three times a day (TID) | ORAL | 0 refills | Status: AC | PRN

## 2024-04-04 NOTE — Discharge Instructions (Addendum)
 Seen today for dizziness and neck pain.  Your dizziness is consistent with vertigo.  Take the meclizine  as prescribed to treat it.  You may also want to have your eyes checked if symptoms do not improve with meclizine .  Continue hydration with plenty of water.  The neck pain is consistent with a muscle strain or possible migraine.  Continue the anti-inflammatory and you can also take the tizanidine  which is a muscle relaxant at nighttime as needed.  Recommend follow-up with a primary care provider, especially if symptoms do not improve with treatment.  Please go to [email or web address] to make an appointment.  Seek care in the emergency room if your symptoms worsen in the meantime.

## 2024-04-04 NOTE — ED Provider Notes (Signed)
 " [facility]    CSN: [id number] Arrival date & time: 04/04/24  1059      History   Chief Complaint Chief Complaint  Patient presents with   Dizziness    Been dizzy and neck pains with a little bit of headaches nauseous and been really tired lately and blurry vision here and there - Entered by patient    HPI Carl Higgins is a 29 y.o. male.   Patient presents today with 3-week history of intermittent dizziness and sharp pain on the sides of his neck.  Report symptoms began when he heard his child fall out of her crib and he abruptly jumped out of bed.  He endorses intermittent room spinning sensation, feeling off balance, and blurry vision.  No chest pain or shortness of breath when he has the dizziness episode.  He endorses nausea but no vomiting.  No new headache, fever, cough, congestion, sore throat, recent fall, trauma, or injury to the head.  Reports neck pain is worse with certain movements or any quick change in positions.  No numbness or tingling going down the arms.  No weakness, pallor, diaphoresis, postural instability.  He took anti-inflammatory medicine for the neck pain which helped temporarily.    Past Medical History:  Diagnosis Date   Adverse effect of other vaccines and biological substances, initial encounter    Gastroesophageal reflux disease without esophagitis    Scabies    Viral wart     Patient Active Problem List   Diagnosis Date Noted   Chest pain 01/26/2015   Palpitations 01/26/2015    Past Surgical History:  Procedure Laterality Date   WART REMOVAL         Home Medications    Prior to Admission medications  Medication Sig Start Date End Date Taking? Authorizing Provider  meclizine  (ANTIVERT ) 12.5 MG tablet Take 1 tablet (12.5 mg total) by mouth 3 (three) times daily as needed for dizziness. 04/04/24  Yes Chandra Harlene LABOR, NP  tiZANidine  (ZANAFLEX ) 4 MG tablet Take 1 tablet (4 mg total) by mouth every 8 (eight) hours as needed for  muscle spasms. Do not take with alcohol or while driving or operating heavy machinery.  May cause drowsiness. 04/04/24  Yes Chandra Harlene LABOR, NP    Family History Family History  Problem Relation Age of Onset   Pancreatitis Mother    Heart disease Father        WPW   Bardet-Biedl Syndrome Brother    Diabetes Maternal Grandfather    Hypertension Maternal Grandfather    Heart attack Paternal Grandfather     Social History Social History[1]   Allergies   Amoxicillin and Sulfa antibiotics   Review of Systems Review of Systems Per HPI  Physical Exam Triage Vital Signs ED Triage Vitals  Encounter Vitals Group     BP 04/04/24 1118 (!) 150/84     Girls Systolic BP Percentile --      Girls Diastolic BP Percentile --      Boys Systolic BP Percentile --      Boys Diastolic BP Percentile --      Pulse Rate 04/04/24 1118 87     Resp 04/04/24 1118 18     Temp 04/04/24 1118 98.1 F (36.7 C)     Temp Source 04/04/24 1118 Oral     SpO2 04/04/24 1118 98 %     Weight --      Height --      Head Circumference --  Peak Flow --      Pain Score 04/04/24 1119 3     Pain Loc --      Pain Education --      Exclude from Growth Chart --    Orthostatic VS for the past 24 hrs:  BP- Lying Pulse- Lying BP- Sitting Pulse- Sitting BP- Standing at 0 minutes Pulse- Standing at 0 minutes  04/04/24 1133 131/78 78 133/88 94 (!) 138/93 102    Updated Vital Signs BP (!) 150/84 (BP Location: Right Arm)   Pulse 87   Temp 98.1 F (36.7 C) (Oral)   Resp 18   SpO2 98%   Visual Acuity Right Eye Distance: 20/20 Left Eye Distance: 20/20 Bilateral Distance: 20/20  Right Eye Near:   Left Eye Near:    Bilateral Near:     Physical Exam Vitals and nursing note reviewed.  Constitutional:      General: He is not in acute distress.    Appearance: Normal appearance. He is not ill-appearing, toxic-appearing or diaphoretic.  HENT:     Head: Normocephalic and atraumatic.     Right Ear:  Tympanic membrane, ear canal and external ear normal. There is no impacted cerumen.     Left Ear: Tympanic membrane, ear canal and external ear normal. There is no impacted cerumen.     Nose: Nose normal. No congestion or rhinorrhea.     Mouth/Throat:     Mouth: Mucous membranes are moist.     Pharynx: Oropharynx is clear. No posterior oropharyngeal erythema.  Eyes:     General: No scleral icterus.    Extraocular Movements: Extraocular movements intact.     Pupils: Pupils are equal, round, and reactive to light.  Cardiovascular:     Rate and Rhythm: Normal rate and regular rhythm.     Heart sounds: No murmur heard. Pulmonary:     Effort: Pulmonary effort is normal. No respiratory distress.     Breath sounds: Normal breath sounds. No wheezing, rhonchi or rales.  Abdominal:     General: Abdomen is flat. Bowel sounds are normal. There is no distension.     Palpations: Abdomen is soft.     Tenderness: There is no abdominal tenderness. There is no right CVA tenderness, left CVA tenderness or guarding.  Musculoskeletal:     Cervical back: Normal range of motion and neck supple. No rigidity or tenderness.  Lymphadenopathy:     Cervical: No cervical adenopathy.  Skin:    General: Skin is warm and dry.     Capillary Refill: Capillary refill takes less than 2 seconds.     Coloration: Skin is not jaundiced or pale.     Findings: No erythema.  Neurological:     General: No focal deficit present.     Mental Status: He is alert and oriented to person, place, and time.     Cranial Nerves: Cranial nerves 2-12 are intact.     Sensory: Sensation is intact.     Motor: No weakness.     Coordination: Coordination is intact. Coordination normal. Heel to [facility] Test normal. Rapid alternating movements normal.     Gait: Gait is intact. Gait normal.  Psychiatric:        Behavior: Behavior is cooperative.      UC Treatments / Results  Labs (all labs ordered are listed, but only abnormal results are  displayed) Labs Reviewed  GLUCOSE, POCT (MANUAL RESULT ENTRY) - Normal  POCT URINE DIPSTICK    EKG  Radiology No results found.  Procedures Procedures (including critical care time)  Medications Ordered in UC Medications - No data to display  Initial Impression / Assessment and Plan / UC Course  I have reviewed the triage vital signs and the nursing notes.  Pertinent labs & imaging results that were available during my care of the patient were reviewed by me and considered in my medical decision making (see chart for details).   Patient is a pleasant, well-appearing 29 year old male presenting today for dizziness and neck pain.  Orthostatic vital signs are negative for orthostasis, however heart rate does increase upon standing.  EKG today shows normal sinus rhythm without significant ST segment or T wave abnormality; no STEMI.  Random blood sugar today is not low and urinalysis is negative for signs of infection or dehydration.  Visual acuity is normal.  Suspect vertigo as cause of dizziness.  Neck pain may be muscle strain given improvement with anti-inflammatory medicine.  Start meclizine  for the vertigo and muscle relaxant for the neck pain.  Strict ER and return precautions were discussed with the patient.  Work excuse provided.  The patient was given the opportunity to ask questions.  All questions answered to their satisfaction.  The patient is in agreement to this plan.   Final Clinical Impressions(s) / UC Diagnoses   Final diagnoses:  Dizziness  Vertigo  Neck pain     Discharge Instructions      Seen today for dizziness and neck pain.  Your dizziness is consistent with vertigo.  Take the meclizine  as prescribed to treat it.  You may also want to have your eyes checked if symptoms do not improve with meclizine .  Continue hydration with plenty of water.  The neck pain is consistent with a muscle strain or possible migraine.  Continue the anti-inflammatory and you can  also take the tizanidine  which is a muscle relaxant at nighttime as needed.  Recommend follow-up with a primary care provider, especially if symptoms do not improve with treatment.  Please go to [email or web address] to make an appointment.  Seek care in the emergency room if your symptoms worsen in the meantime.    ED Prescriptions     Medication Sig Dispense Auth. Provider   meclizine  (ANTIVERT ) 12.5 MG tablet Take 1 tablet (12.5 mg total) by mouth 3 (three) times daily as needed for dizziness. 30 tablet Chandra Raisin A, NP   tiZANidine  (ZANAFLEX ) 4 MG tablet Take 1 tablet (4 mg total) by mouth every 8 (eight) hours as needed for muscle spasms. Do not take with alcohol or while driving or operating heavy machinery.  May cause drowsiness. 30 tablet Chandra Raisin LABOR, NP      PDMP not reviewed this encounter.     [1]  Social History Tobacco Use   Smoking status: Never   Smokeless tobacco: Never  Substance Use Topics   Alcohol use: No    Alcohol/week: 0.0 standard drinks of alcohol   Drug use: No     Chandra Raisin LABOR, NP 04/04/24 1440  "

## 2024-04-04 NOTE — ED Triage Notes (Addendum)
 Dizziness, sharp pain on the back of his neck going to his head that comes and goes, fatigue, nausea, pt states the dizziness is worse when moving his head x 3 weeks.  Pt states he jumped up one night really fast and he noticed the dizziness right after that.

## 2024-04-05 ENCOUNTER — Emergency Department (HOSPITAL_COMMUNITY)
Admission: EM | Admit: 2024-04-05 | Discharge: 2024-04-06 | Disposition: A | Discharge status: Home / Self Care | Attending: Emergency Medicine | Admitting: Emergency Medicine

## 2024-04-05 ENCOUNTER — Other Ambulatory Visit: Payer: Self-pay

## 2024-04-05 ENCOUNTER — Emergency Department (HOSPITAL_COMMUNITY)

## 2024-04-05 ENCOUNTER — Encounter (HOSPITAL_COMMUNITY): Payer: Self-pay

## 2024-04-05 DIAGNOSIS — R42 Dizziness and giddiness: Secondary | ICD-10-CM | POA: Diagnosis present

## 2024-04-05 DIAGNOSIS — M542 Cervicalgia: Secondary | ICD-10-CM | POA: Diagnosis not present

## 2024-04-05 DIAGNOSIS — R110 Nausea: Secondary | ICD-10-CM | POA: Insufficient documentation

## 2024-04-05 LAB — CBC WITH DIFFERENTIAL/PLATELET
Abs Immature Granulocytes: 0.02 K/uL (ref 0.00–0.07)
Basophils Absolute: 0.1 K/uL (ref 0.0–0.1)
Basophils Relative: 1 %
Eosinophils Absolute: 0.2 K/uL (ref 0.0–0.5)
Eosinophils Relative: 2 %
HCT: 48.2 % (ref 39.0–52.0)
Hemoglobin: 16.8 g/dL (ref 13.0–17.0)
Immature Granulocytes: 0 %
Lymphocytes Relative: 32 %
Lymphs Abs: 2.6 K/uL (ref 0.7–4.0)
MCH: 30.2 pg (ref 26.0–34.0)
MCHC: 34.9 g/dL (ref 30.0–36.0)
MCV: 86.5 fL (ref 80.0–100.0)
Monocytes Absolute: 0.6 K/uL (ref 0.1–1.0)
Monocytes Relative: 8 %
Neutro Abs: 4.6 K/uL (ref 1.7–7.7)
Neutrophils Relative %: 57 %
Platelets: 197 K/uL (ref 150–400)
RBC: 5.57 MIL/uL (ref 4.22–5.81)
RDW: 12.1 % (ref 11.5–15.5)
WBC: 8 K/uL (ref 4.0–10.5)
nRBC: 0 % (ref 0.0–0.2)

## 2024-04-05 LAB — BASIC METABOLIC PANEL WITH GFR
Anion gap: 14 (ref 5–15)
BUN: 10 mg/dL (ref 6–20)
CO2: 24 mmol/L (ref 22–32)
Calcium: 10 mg/dL (ref 8.9–10.3)
Chloride: 105 mmol/L (ref 98–111)
Creatinine, Ser: 1.02 mg/dL (ref 0.61–1.24)
GFR, Estimated: >60 mL/min
Glucose, Bld: 105 mg/dL — ABNORMAL HIGH (ref 70–99)
Potassium: 4.2 mmol/L (ref 3.5–5.1)
Sodium: 142 mmol/L (ref 135–145)

## 2024-04-05 MED ORDER — IOHEXOL 350 MG/ML SOLN
75.0000 mL | Freq: Once | INTRAVENOUS | Status: AC | PRN
  Administered 2024-04-05: 75 mL via INTRAVENOUS

## 2024-04-05 NOTE — ED Triage Notes (Signed)
 Pt states that he hit his head in December and even since he has had off and on dizziness since when standing and some ringing in ears. Pt was seen at [facility] yesterday and was given muscle relaxer and something for his dizziness. Pt states that he has been having off and on sharp pain in his neck as well.

## 2024-04-05 NOTE — ED Notes (Signed)
 ED Provider at bedside.

## 2024-04-05 NOTE — ED Provider Notes (Signed)
 " [place] [facility]GENCY DEPARTMENT AT [facility] Provider Note   CSN: [id number] Arrival date & time: 04/05/24  1941     Patient presents with: Dizziness   Carl Higgins is a 29 y.o. male.   Patient to ED for evaluation of room spinning dizziness that started suddenly 3 weeks ago. He was lying in bed and heard his daughter fall from her crib. He jumped up and had sudden onset of intense dizziness with nausea. Symptoms persisted for about 4 days then resolved for one week before recurring. He reports the development of a sharp pain in the left lateral neck into the occipital scalp in the interim. Nausea is persistent, no vomiting. No fever. No visual change, syncope or near syncope.   The history is provided by the patient. No language interpreter was used.  Dizziness      Prior to Admission medications  Medication Sig Start Date End Date Taking? Authorizing Provider  meclizine  (ANTIVERT ) 12.5 MG tablet Take 1 tablet (12.5 mg total) by mouth 3 (three) times daily as needed for dizziness. 04/04/24   Chandra Harlene LABOR, NP  tiZANidine  (ZANAFLEX ) 4 MG tablet Take 1 tablet (4 mg total) by mouth every 8 (eight) hours as needed for muscle spasms. Do not take with alcohol or while driving or operating heavy machinery.  May cause drowsiness. 04/04/24   Chandra Harlene LABOR, NP    Allergies: Amoxicillin and Sulfa antibiotics    Review of Systems  Neurological:  Positive for dizziness.    Updated Vital Signs BP 129/77   Pulse 69   Temp 98.7 F (37.1 C) (Oral)   Resp 16   SpO2 98%   Physical Exam Constitutional:      General: He is not in acute distress.    Appearance: He is well-developed.  HENT:     Head: Normocephalic.  Eyes:     General: No visual field deficit.    Extraocular Movements: Extraocular movements intact.     Pupils: Pupils are equal, round, and reactive to light.  Neck:   Cardiovascular:     Rate and Rhythm: Normal rate and regular rhythm.     Heart  sounds: No murmur heard. Pulmonary:     Effort: Pulmonary effort is normal.     Breath sounds: Normal breath sounds. No wheezing, rhonchi or rales.  Abdominal:     General: Bowel sounds are normal.     Palpations: Abdomen is soft.     Tenderness: There is no abdominal tenderness. There is no guarding or rebound.  Musculoskeletal:        General: Normal range of motion.     Cervical back: Normal range of motion and neck supple.  Skin:    General: Skin is warm and dry.  Neurological:     General: No focal deficit present.     Mental Status: He is alert and oriented to person, place, and time.     GCS: GCS eye subscore is 4. GCS verbal subscore is 5. GCS motor subscore is 6.     Cranial Nerves: Cranial nerves 2-12 are intact. No cranial nerve deficit, dysarthria or facial asymmetry.     Sensory: Sensation is intact.     Motor: No weakness or pronator drift.     Coordination: Finger-Nose-Finger Test normal.     Gait: Gait is intact.     Comments: +left lateral nystagmus     (all labs ordered are listed, but only abnormal results are displayed) Labs Reviewed  BASIC METABOLIC PANEL WITH GFR - Abnormal; Notable for the following components:      Result Value   Glucose, Bld 105 (*)    All other components within normal limits  CBC WITH DIFFERENTIAL/PLATELET    EKG: None  Radiology: No results found.   Procedures   Medications Ordered in the ED  iohexol  (OMNIPAQUE ) 350 MG/ML injection 75 mL (75 mLs Intravenous Contrast Given 04/05/24 2351)    Clinical Course as of 04/08/24 0636  Tue Apr 05, 2024  2249 28 yo otherwise healthy patient presents with vertiginous type dizziness, off and on for 3 weeks, interim onset left neck pain into the occipital area. Neurologic exam is reassuring and without deficit. Discussed with Dr. Francesca. Concern for dissection given symptoms and neck pain. CTA head and neck pending.  [SU]  Wed Apr 06, 2024  0135 Patient care signed out to dr. Bari  pending result of CTA.  [SU]    Clinical Course User Index [SU] Odell Balls, PA-C                                 Medical Decision Making Amount and/or Complexity of Data Reviewed Labs: ordered. Radiology: ordered.  Risk Prescription drug management.        Final diagnoses:  Dizziness    ED Discharge Orders     None          Odell Balls, PA-C 04/08/24 0636  "

## 2024-04-06 NOTE — ED Provider Notes (Signed)
 Patient signed out pending CTA.  CTA negative.  No acute bleed.  No aneurysm.  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  Encourage patient to trial meclizine .  He is feels much better. Physical Exam  BP 123/82   Pulse 63   Temp 98.7 F (37.1 C) (Oral)   Resp 15   SpO2 97%   Physical Exam Awake, alert, no acute distress. Procedures  Procedures  ED Course / MDM   Clinical Course as of 04/06/24 0130  Tue Apr 05, 2024  2249 29 yo otherwise healthy patient presents with vertiginous type dizziness, off and on for 3 weeks, interim onset left neck pain into the occipital area. Neurologic exam is reassuring and without deficit. Discussed with Dr. Francesca. Concern for dissection given symptoms and neck pain. CTA head and neck pending.  [SU]    Clinical Course User Index [SU] Odell Balls, PA-C   Medical Decision Making Amount and/or Complexity of Data Reviewed Labs: ordered. Radiology: ordered.  Risk Prescription drug management.          Bari Charmaine FALCON, MD 04/06/24 0130

## 2024-04-06 NOTE — Discharge Instructions (Signed)
 You were seen today for dizziness.  Your workup is reassuring.  This could be related to benign positional vertigo.  Trial meclizine  to see if this helps.
